# Patient Record
Sex: Male | Born: 1985 | Race: Black or African American | Hispanic: No | Marital: Single | Smoking: Never smoker
Health system: Southern US, Community
[De-identification: ages and names within clinical notes are randomized; demographics above are authoritative.]

## PROBLEM LIST (undated history)

## (undated) DIAGNOSIS — K429 Umbilical hernia without obstruction or gangrene: Secondary | ICD-10-CM

## (undated) HISTORY — PX: CYST EXCISION: SHX5701

## (undated) HISTORY — PX: WISDOM TOOTH EXTRACTION: SHX21

---
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
## 2009-11-30 ENCOUNTER — Emergency Department (HOSPITAL_COMMUNITY): Admission: EM | Admit: 2009-11-30 | Discharge: 2009-11-30 | Payer: Self-pay | Admitting: Family Medicine

## 2010-01-30 ENCOUNTER — Emergency Department (HOSPITAL_COMMUNITY): Admission: EM | Admit: 2010-01-30 | Discharge: 2010-01-30 | Payer: Self-pay | Admitting: Family Medicine

## 2013-04-29 ENCOUNTER — Ambulatory Visit: Payer: Self-pay | Admitting: Internal Medicine

## 2013-05-13 ENCOUNTER — Ambulatory Visit: Payer: Self-pay | Admitting: Internal Medicine

## 2013-06-05 ENCOUNTER — Encounter: Payer: Self-pay | Admitting: Internal Medicine

## 2013-06-05 ENCOUNTER — Ambulatory Visit (INDEPENDENT_AMBULATORY_CARE_PROVIDER_SITE_OTHER): Payer: BC Managed Care – PPO | Admitting: Internal Medicine

## 2013-06-05 VITALS — BP 110/82 | HR 72 | Temp 98.1°F | Ht 77.0 in | Wt 195.0 lb

## 2013-06-05 DIAGNOSIS — Z23 Encounter for immunization: Secondary | ICD-10-CM

## 2013-06-05 DIAGNOSIS — Z0000 Encounter for general adult medical examination without abnormal findings: Secondary | ICD-10-CM

## 2013-06-05 LAB — BASIC METABOLIC PANEL
BUN: 9 mg/dL (ref 6–23)
CO2: 28 mEq/L (ref 19–32)
Calcium: 9.6 mg/dL (ref 8.4–10.5)
Chloride: 104 mEq/L (ref 96–112)
Creatinine, Ser: 1 mg/dL (ref 0.4–1.5)
GFR: 109.62 mL/min (ref >60.00)
Glucose, Bld: 102 mg/dL — ABNORMAL HIGH (ref 70–99)
Potassium: 3.7 mEq/L (ref 3.5–5.1)
Sodium: 138 mEq/L (ref 135–145)

## 2013-06-05 LAB — HEPATIC FUNCTION PANEL
ALT: 24 U/L (ref 0–53)
AST: 18 U/L (ref 0–37)
Albumin: 4.4 g/dL (ref 3.5–5.2)
Alkaline Phosphatase: 66 U/L (ref 39–117)
Bilirubin, Direct: 0 mg/dL (ref 0.0–0.3)
Total Bilirubin: 1 mg/dL (ref 0.3–1.2)
Total Protein: 7.8 g/dL (ref 6.0–8.3)

## 2013-06-05 LAB — LIPID PANEL
Cholesterol: 196 mg/dL (ref 0–200)
HDL: 63.8 mg/dL (ref >39.00)
LDL Cholesterol: 114 mg/dL — ABNORMAL HIGH (ref 0–99)
Total CHOL/HDL Ratio: 3
Triglycerides: 91 mg/dL (ref 0.0–149.0)
VLDL: 18.2 mg/dL (ref 0.0–40.0)

## 2013-06-05 LAB — TSH: TSH: 0.37 u[IU]/mL (ref 0.35–5.50)

## 2013-06-05 NOTE — Assessment & Plan Note (Signed)
Reviewed adult health maintenance protocols.  I encouraged healthy diet and regular exercise. Patient updated with Tdap.  Obtain screening labs.

## 2013-06-05 NOTE — Patient Instructions (Signed)
Please sign up for MyChart to view your lab results.

## 2013-06-05 NOTE — Progress Notes (Signed)
  Subjective:    Patient ID: Manuel Murray, male    DOB: Dec 19, 1985, 27 y.o.   MRN: [id number]  HPI  27 year old [place] American male here for routine physical. He denies any history of chronic illnesses. He did not take any medications.  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  He grew up in [place]. He moved to [place] 10 years ago.  He does not exercise on a regular basis.  He enjoys sweets.  Not enough fresh fruits and vegetables.  Review of Systems  Constitutional: Negative for activity change, appetite change and unexpected weight change.  Eyes: Negative for visual disturbance.  Respiratory: Negative for cough, chest tightness and shortness of breath.   Cardiovascular: Negative for chest pain.  Genitourinary: Negative for difficulty urinating.  Neurological: Negative for headaches.  Gastrointestinal: Negative for abdominal pain, heartburn melena or hematochezia Psych: Negative for depression or anxiety ID:  No hx of STDs.  No currently sexually active  History reviewed. No pertinent past medical history.  History   Social History  . Marital Status: Single    Spouse Name: N/A    Number of Children: N/A  . Years of Education: N/A   Occupational History  . Teacher's assistant     Kindergarten   Social History Main Topics  . Smoking status: Never Smoker   . Smokeless tobacco: Not on file  . Alcohol Use: Yes  . Drug Use: No  . Sexually Active: Not on file   Other Topics Concern  . Not on file   Social History Narrative   Single    Past Surgical History  Procedure Laterality Date  . Wisdom tooth extraction      Family History  Problem Relation Age of Onset  . Myasthenia gravis Mother   . Breast cancer Paternal Grandmother     Allergies  Allergen Reactions  . Penicillins     No current outpatient prescriptions on file prior to visit.   No current facility-administered medications on file  prior to visit.    BP 110/82  Pulse 72  Temp(Src) 98.1 F (36.7 C) (Oral)  Ht 6\' 5"  (1.956 m)  Wt 195 lb (88.451 kg)  BMI 23.12 kg/m2      Objective:   Physical Exam  Constitutional: He is oriented to person, place, and time. He appears well-developed and well-nourished.  HENT:  Head: Normocephalic and atraumatic.  Right Ear: External ear normal.  Left Ear: External ear normal.  Mouth/Throat: Oropharynx is clear and moist.  Eyes: Conjunctivae and EOM are normal. Pupils are equal, round, and reactive to light.  Neck: Neck supple.  No carotid bruit  Cardiovascular: Normal rate, regular rhythm and normal heart sounds.   No murmur heard. Pulmonary/Chest: Effort normal and breath sounds normal. He has no wheezes.  Abdominal: Soft. Bowel sounds are normal. He exhibits no mass. There is no tenderness.  No organomegaly  Musculoskeletal: Normal range of motion. He exhibits no edema.  Lymphadenopathy:    He has no cervical adenopathy.  Neurological: He is alert and oriented to person, place, and time. No cranial nerve deficit.  Skin: Skin is warm and dry.  Psychiatric: He has a normal mood and affect. His behavior is normal.          Assessment & Plan:

## 2013-06-06 LAB — CBC WITH DIFFERENTIAL/PLATELET
Basophils Absolute: 0.1 10*3/uL (ref 0.0–0.1)
Basophils Relative: 1.1 % (ref 0.0–3.0)
Eosinophils Absolute: 0.4 10*3/uL (ref 0.0–0.7)
Eosinophils Relative: 5.6 % — ABNORMAL HIGH (ref 0.0–5.0)
HCT: 48.5 % (ref 39.0–52.0)
Hemoglobin: 16.2 g/dL (ref 13.0–17.0)
Lymphocytes Relative: 27.5 % (ref 12.0–46.0)
Lymphs Abs: 1.9 10*3/uL (ref 0.7–4.0)
MCHC: 33.3 g/dL (ref 30.0–36.0)
MCV: 87.2 fl (ref 78.0–100.0)
Monocytes Absolute: 0.5 10*3/uL (ref 0.1–1.0)
Monocytes Relative: 7.2 % (ref 3.0–12.0)
Neutro Abs: 4.2 10*3/uL (ref 1.4–7.7)
Neutrophils Relative %: 58.6 % (ref 43.0–77.0)
Platelets: 215 10*3/uL (ref 150.0–400.0)
RBC: 5.57 Mil/uL (ref 4.22–5.81)
RDW: 13.3 % (ref 11.5–14.6)
WBC: 7.1 10*3/uL (ref 4.5–10.5)

## 2013-06-06 LAB — HIV ANTIBODY (ROUTINE TESTING W REFLEX): HIV: NONREACTIVE

## 2013-10-23 ENCOUNTER — Other Ambulatory Visit: Payer: Self-pay

## 2013-12-04 ENCOUNTER — Telehealth: Payer: Self-pay | Admitting: Internal Medicine

## 2013-12-04 NOTE — Telephone Encounter (Signed)
[facility] Triage Call Report Triage Record Num: [id number] Operator: Valene Bors Patient Name: Manuel Murray Call Date & Time: 12/03/2013 5:21:29PM Patient Phone: [phone] PCP: Thomos Lemons Patient Gender: Male PCP Fax : [phone] Patient DOB: [birth year] [facility] Name: [facility] Reason for Call: Caller: [name]/Patient; PCP: Ethelene Hal Molly Maduro) (Adults only); CB#: [phone]; Call regarding started not feeling well on 12/01/13 with fever and vomiting. Today he is feeling tired and getting chills but no fever. No cough or congestion. He is taking Muscinex Cold and Flu formuls. Chest feel tight periodically. 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 Triage and Care advice per Flu Like Symptoms Protocol and advised to call back if sx worsen or persist. Protocol(s) Used: Flu-Like Symptoms Recommended Outcome per Protocol: Provide Home/Self Care Reason for Outcome: All other situations Care Advice: ~ Use a cool mist humidifier to moisten air. Be sure to clean according to manufacturer's instructions. ~ Rest until symptoms improve. ~ Consider use of a saline nasal spray per package directions to help relieve nasal congestion. ~ SYMPTOM / CONDITION MANAGEMENT Coughing up mucus or phlegm helps to get rid of an infection. A productive cough should not be stopped. A cough medicine with guaifenesin (Robitussin, Mucinex) can help loosen the mucus. Cough medicine with dextromethorphan (DM) should be avoided. Drinking lots of fluids can help loosen the mucus too, especially warm fluids. ~ COMFORT MEASURES FOR A FEVER: - Drink cool liquids or eat ice chips or popsicles. Avoid drinks with alcohol or caffeine. - Wear one layer of light-weight clothing. - Consider using a fan to improve circulation. - Rest until temperature returns to normal and other symptoms improve. - Use a lightweight blanket or other bedding. - A  lukewarm (not cold) bath or shower can help lower body temperature. Cold water can cause shivering and raise temperature. If shivering starts, dry off and cover with lightweight clothing. ~ Analgesic/Antipyretic Advice - Acetaminophen: Consider acetaminophen as directed on label or by pharmacist/provider for pain or fever. PRECAUTIONS: - Use only if there is no history of liver disease, alcoholism, or intake of three or more alcohol drinks per day. - If approved by provider when breastfeeding. - Do not exceed recommended dose or frequency. ~ Analgesic/Antipyretic Advice - NSAIDs: Consider aspirin, ibuprofen, naproxen or ketoprofen for pain or fever as directed on label or by pharmacist/provider. PRECAUTIONS: - If over 73 years of age, should not take longer than 1 week without consulting provider. EXCEPTIONS: - Should not be used if taking blood thinners or have bleeding problems. - Do not use if have history of sensitivity/allergy to any of these medications; or history of cardiovascular, ulcer, kidney, liver disease or diabetes unless approved by provider. - Do not exceed recommended dose or frequency. ~ Influenza - Expected Course: - Symptoms start to improve in 3 to 7 days. ~ 12/03/2013 5:41:59PM Page 1 of 2 [id number] [facility] Triage Call Report Patient Name: Manuel Murray continuation page/s - Cough and feeling tired (malaise) may continue for several weeks. - Cough and extreme fatigue lasting more than 3 weeks needs medical evaluation. - Remain at home at least 24 hours after being free of fever 100F (37.8C) without the use of fever reducing medication. Influenza - Transmission: - Flu virus is spread through the air in droplets when a flu-infected person coughs, sneezes or talks and another person breathes in the droplets, or by touching a surface like a door knob, telephone, or keyboard that has been  contaminated by the droplets and then touching the mouth,  nose, or eyes. - An individual may be passing on the flu before they have any symptoms. - An individual may continue to be contagious with the flu for up to 7 days after the symptoms start. H1N1 influenza may continue to be contagious as long as cough persists. ~ Influenza Respiratory Hygiene: - Cover the nose/mouth tightly with a tissue when coughing or sneezing. - Use tissue one time and discard in the nearest waste receptacle. - Wash hands with soap and water or alcohol-based hand rub for at least 15 seconds after coming into contact with respiratory secretions and contaminated objects/materials. - When a tissue is not available, cough into the bend of the elbow. - Avoid touching your eyes, nose or mouth. - When ill wear a disposable face mask when around others, especially around those at high risk for flu-related complications, to help decrease the likelihood of infecting them. ~ Influenza Prevention - Personal Hygiene: - Wash your hands with soap and water often, for at least 15 seconds, especially after you cough or sneeze or when you have touched a contaminated surface/object (door knob, telephone, etc.) - If soap and water are not available, use an alcohol-based hand cleaner containing at least 60% alcohol, rub hands together until hands are dry. - Avoid putting your hands and fingers to the face, nose, mouth or eyes. - During the flu season avoid crowded places (shopping areas, planes, sporting events). ~ Do not go to work, school, or any community activity until you have not had a fever (100F or 37.8C) for at least 24 hours without taking fever-reducing medication. This means staying at home for at least 3 to 5, possibly 7 days. ~ ~ If having flu-like symptoms, postpone any travel, especially international travel. You may be quarantined. Total water intake includes drinking water, water in beverages, and water contained in food. Fluids make up about 80% of the body's total  hydration need. Individual fluid requirement to maintain hydration vary based on physical activity, environmental factors and illness. Limit fluids that contain sugar, caffeine, or alcohol. Urine will be very light yellow color when you drink enough fluids. ~ 12

## 2014-02-09 ENCOUNTER — Encounter: Payer: Self-pay | Admitting: Internal Medicine

## 2014-02-09 ENCOUNTER — Ambulatory Visit (INDEPENDENT_AMBULATORY_CARE_PROVIDER_SITE_OTHER): Payer: BC Managed Care – PPO | Admitting: Internal Medicine

## 2014-02-09 VITALS — BP 110/82 | Temp 99.1°F | Ht 77.0 in | Wt 195.0 lb

## 2014-02-09 DIAGNOSIS — H6120 Impacted cerumen, unspecified ear: Secondary | ICD-10-CM

## 2014-02-09 DIAGNOSIS — J069 Acute upper respiratory infection, unspecified: Secondary | ICD-10-CM

## 2014-02-09 MED ORDER — DOXYCYCLINE HYCLATE 100 MG PO TABS: 100.0000 mg | ORAL_TABLET | Freq: Two times a day (BID) | ORAL | Status: DC

## 2014-02-09 NOTE — Patient Instructions (Signed)
Gargle with warm salt water and use intranasal saline as directed Please contact our office if your symptoms do not improve or gets worse.

## 2014-02-09 NOTE — Progress Notes (Signed)
   Subjective:    Patient ID: Manuel Murray, male    DOB: 09/23/1986, 28 y.o.   MRN: [id number]  HPI  28 year old African American male complains of sinus/upper respiratory congestion for 5-6 days. He also has associated nonproductive cough. When he blows his nose his sputum is yellowish in color. He denies any severe headache or sinus pressure.  He has low-grade fever today but denies any fever or chills.  Patient has history of penicillin allergy. Allergic reaction occurred when he was an infant. He does not know his reaction to penicillin.  He also complains of fullness sensation of both ears.  He has hx of cerumen impaction.  Review of Systems No fever, yellowish sputum.    No past medical history on file.  History   Social History  . Marital Status: Single    Spouse Name: N/A    Number of Children: N/A  . Years of Education: N/A   Occupational History  . Teacher's assistant     Kindergarten   Social History Main Topics  . Smoking status: Never Smoker   . Smokeless tobacco: Not on file  . Alcohol Use: Yes  . Drug Use: No  . Sexual Activity: Not on file   Other Topics Concern  . Not on file   Social History Narrative   Single    Past Surgical History  Procedure Laterality Date  . Wisdom tooth extraction      Family History  Problem Relation Age of Onset  . Myasthenia gravis Mother   . Breast cancer Paternal Grandmother     Allergies  Allergen Reactions  . Penicillins     No current outpatient prescriptions on file prior to visit.   No current facility-administered medications on file prior to visit.    BP 110/82  Temp(Src) 99.1 F (37.3 C) (Oral)  Ht 6\' 5"  (1.956 m)  Wt 195 lb (88.451 kg)  BMI 23.12 kg/m2    Objective:   Physical Exam  Constitutional: He is oriented to person, place, and time. He appears well-developed and well-nourished. No distress.  HENT:  Head: Normocephalic and atraumatic.  Mouth/Throat: No oropharyngeal  exudate.  Bilateral cerumen impaction Oropharyngeal erythema  Neck:  No neck tenderness  Cardiovascular: Normal rate, regular rhythm and normal heart sounds.   No murmur heard. Pulmonary/Chest: Effort normal and breath sounds normal. He has no wheezes. He has no rales.  Lymphadenopathy:    He has no cervical adenopathy.  Neurological: He is alert and oriented to person, place, and time. No cranial nerve deficit.  Psychiatric: He has a normal mood and affect. His behavior is normal.          Assessment & Plan:

## 2014-02-09 NOTE — Progress Notes (Signed)
Pre visit review using our clinic review tool, if applicable. No additional management support is needed unless otherwise documented below in the visit note. 

## 2014-02-09 NOTE — Assessment & Plan Note (Signed)
Patient has bilateral cerumen impaction. Bilateral external auditory canals were irrigated. Utilized to curette to remove cerumen plug of left ear and partial removal of cerumen plug of right ear.

## 2014-02-09 NOTE — Assessment & Plan Note (Signed)
28 year old [place] American male with upper respiratory infection x 1 week. Patient may have early sinusitis. I suggested symptomatic treatment for additional 2-3 days. If worsening sinus congestion and purulent nasal discharge patient to use doxycycline 100 mg twice daily for 10 days. Patient advised to call office if symptoms persist or worsen.

## 2014-04-29 ENCOUNTER — Ambulatory Visit: Payer: BC Managed Care – PPO | Admitting: Internal Medicine

## 2014-06-08 ENCOUNTER — Other Ambulatory Visit (INDEPENDENT_AMBULATORY_CARE_PROVIDER_SITE_OTHER): Payer: BC Managed Care – PPO

## 2014-06-08 DIAGNOSIS — Z0000 Encounter for general adult medical examination without abnormal findings: Secondary | ICD-10-CM

## 2014-06-08 LAB — HEPATIC FUNCTION PANEL
ALT: 31 U/L (ref 0–53)
AST: 26 U/L (ref 0–37)
Albumin: 4.1 g/dL (ref 3.5–5.2)
Alkaline Phosphatase: 68 U/L (ref 39–117)
Bilirubin, Direct: 0.1 mg/dL (ref 0.0–0.3)
Total Bilirubin: 0.5 mg/dL (ref 0.2–1.2)
Total Protein: 6.8 g/dL (ref 6.0–8.3)

## 2014-06-08 LAB — BASIC METABOLIC PANEL
BUN: 10 mg/dL (ref 6–23)
CO2: 30 mEq/L (ref 19–32)
Calcium: 9.5 mg/dL (ref 8.4–10.5)
Chloride: 105 mEq/L (ref 96–112)
Creatinine, Ser: 1 mg/dL (ref 0.4–1.5)
GFR: 116.55 mL/min (ref >60.00)
Glucose, Bld: 96 mg/dL (ref 70–99)
Potassium: 4.2 mEq/L (ref 3.5–5.1)
Sodium: 141 mEq/L (ref 135–145)

## 2014-06-08 LAB — LIPID PANEL
Cholesterol: 178 mg/dL (ref 0–200)
HDL: 65.3 mg/dL (ref >39.00)
LDL Cholesterol: 91 mg/dL (ref 0–99)
NonHDL: 112.7
Total CHOL/HDL Ratio: 3
Triglycerides: 107 mg/dL (ref 0.0–149.0)
VLDL: 21.4 mg/dL (ref 0.0–40.0)

## 2014-06-08 LAB — TSH: TSH: 1.19 u[IU]/mL (ref 0.35–4.50)

## 2014-06-08 LAB — POCT URINALYSIS DIPSTICK
Bilirubin, UA: NEGATIVE
Blood, UA: NEGATIVE
Glucose, UA: NEGATIVE
Ketones, UA: NEGATIVE
Leukocytes, UA: NEGATIVE
Nitrite, UA: NEGATIVE
Protein, UA: NEGATIVE
Spec Grav, UA: >=1.03
Urobilinogen, UA: 0.2
pH, UA: 5.5

## 2014-06-08 LAB — CBC WITH DIFFERENTIAL/PLATELET
Basophils Absolute: 0.1 10*3/uL (ref 0.0–0.1)
Basophils Relative: 0.8 % (ref 0.0–3.0)
Eosinophils Absolute: 0.5 10*3/uL (ref 0.0–0.7)
Eosinophils Relative: 7.9 % — ABNORMAL HIGH (ref 0.0–5.0)
HCT: 45.6 % (ref 39.0–52.0)
Hemoglobin: 15.1 g/dL (ref 13.0–17.0)
Lymphocytes Relative: 33.1 % (ref 12.0–46.0)
Lymphs Abs: 2.3 10*3/uL (ref 0.7–4.0)
MCHC: 33.2 g/dL (ref 30.0–36.0)
MCV: 86.2 fl (ref 78.0–100.0)
Monocytes Absolute: 0.6 10*3/uL (ref 0.1–1.0)
Monocytes Relative: 8.5 % (ref 3.0–12.0)
Neutro Abs: 3.4 10*3/uL (ref 1.4–7.7)
Neutrophils Relative %: 49.7 % (ref 43.0–77.0)
Platelets: 212 10*3/uL (ref 150.0–400.0)
RBC: 5.3 Mil/uL (ref 4.22–5.81)
RDW: 13.6 % (ref 11.5–15.5)
WBC: 6.8 10*3/uL (ref 4.0–10.5)

## 2014-06-15 ENCOUNTER — Encounter: Payer: Self-pay | Admitting: Internal Medicine

## 2014-06-15 ENCOUNTER — Ambulatory Visit (INDEPENDENT_AMBULATORY_CARE_PROVIDER_SITE_OTHER): Payer: BC Managed Care – PPO | Admitting: Internal Medicine

## 2014-06-15 VITALS — BP 104/74 | HR 68 | Temp 98.2°F | Ht 76.0 in | Wt 198.0 lb

## 2014-06-15 DIAGNOSIS — H0019 Chalazion unspecified eye, unspecified eyelid: Secondary | ICD-10-CM

## 2014-06-15 DIAGNOSIS — Z111 Encounter for screening for respiratory tuberculosis: Secondary | ICD-10-CM

## 2014-06-15 DIAGNOSIS — Z0000 Encounter for general adult medical examination without abnormal findings: Secondary | ICD-10-CM

## 2014-06-15 DIAGNOSIS — H0013 Chalazion right eye, unspecified eyelid: Secondary | ICD-10-CM

## 2014-06-15 NOTE — Progress Notes (Signed)
   Subjective:    Patient ID: Manuel Murray, male    DOB: Mar 04, 1986, 28 y.o.   MRN: [id number]  HPI  28 year old [place] American male for routine physical. Patient denies any significant interval history. Patient's social history and family history reviewed and updated.  He has red bump over right eye lid.  Review of Systems   Constitutional: Negative for activity change, appetite change and unexpected weight change.  Eyes: Negative for visual disturbance.  Respiratory: Negative for cough, chest tightness and shortness of breath.   Cardiovascular: Negative for chest pain.  Genitourinary: Negative for difficulty urinating.  Neurological: Negative for headaches.  Gastrointestinal: Negative for abdominal pain, heartburn melena or hematochezia Psych: Negative for depression or anxiety ID: negative for GU symptoms.  He is not sexually active.        No past medical history on file.  History   Social History  . Marital Status: Single    Spouse Name: N/A    Number of Children: N/A  . Years of Education: N/A   Occupational History  . Teacher's assistant     Kindergarten   Social History Main Topics  . Smoking status: Never Smoker   . Smokeless tobacco: Not on file  . Alcohol Use: Yes  . Drug Use: No  . Sexual Activity: Not on file   Other Topics Concern  . Not on file   Social History Narrative   Single    Past Surgical History  Procedure Laterality Date  . Wisdom tooth extraction      Family History  Problem Relation Age of Onset  . Myasthenia gravis Mother   . Breast cancer Paternal Grandmother     Allergies  Allergen Reactions  . Penicillins     No current outpatient prescriptions on file prior to visit.   No current facility-administered medications on file prior to visit.    BP 104/74  Pulse 68  Temp(Src) 98.2 F (36.8 C) (Oral)  Ht 6\' 4"  (1.93 m)  Wt 198 lb (89.812 kg)  BMI 24.11 kg/m2    Objective:   Physical Exam    Constitutional: He is oriented to person, place, and time. He appears well-developed and well-nourished. No distress.  HENT:  Head: Normocephalic and atraumatic.  Right Ear: External ear normal.  Left Ear: External ear normal.  Mouth/Throat: Oropharynx is clear and moist.  Eyes: Conjunctivae and EOM are normal. Pupils are equal, round, and reactive to light.  Right upper eyelid chalazion  Neck: Neck supple. No thyromegaly present.  Cardiovascular: Normal rate, regular rhythm and normal heart sounds.   No murmur heard. Pulmonary/Chest: Effort normal and breath sounds normal. He has no wheezes.  Abdominal: Soft. Bowel sounds are normal. He exhibits no distension and no mass.  No organomegaly  Musculoskeletal: He exhibits no edema.  Lymphadenopathy:    He has no cervical adenopathy.  Neurological: He is alert and oriented to person, place, and time. No cranial nerve deficit.  Psychiatric: He has a normal mood and affect. His behavior is normal.          Assessment & Plan:

## 2014-06-15 NOTE — Progress Notes (Signed)
Pre visit review using our clinic review tool, if applicable. No additional management support is needed unless otherwise documented below in the visit note. 

## 2014-06-15 NOTE — Assessment & Plan Note (Signed)
Reviewed adult health maintenance protocols.  Screening labs reviewed with patient.  Social and family history updated. Patient up-to-date with adult vaccines. Patient's weight is normal.

## 2014-06-15 NOTE — Assessment & Plan Note (Signed)
Patient has chalazion right upper eyelid. Patient advised to use warm compress. If persistent or worsening symptoms, he will followup with ophthalmologist.

## 2014-06-17 LAB — TB SKIN TEST
Induration: 0 mm
TB Skin Test: NEGATIVE

## 2014-06-17 NOTE — Addendum Note (Signed)
Addended by: Townsend Roger D on: 06/17/2014 05:03 PM   Modules accepted: Orders

## 2015-04-15 ENCOUNTER — Encounter: Payer: Self-pay | Admitting: Family Medicine

## 2015-04-15 ENCOUNTER — Ambulatory Visit (INDEPENDENT_AMBULATORY_CARE_PROVIDER_SITE_OTHER): Payer: BC Managed Care – PPO | Admitting: Family Medicine

## 2015-04-15 VITALS — BP 110/80 | HR 70 | Temp 98.0°F | Wt 202.0 lb

## 2015-04-15 DIAGNOSIS — K116 Mucocele of salivary gland: Secondary | ICD-10-CM | POA: Diagnosis not present

## 2015-04-15 NOTE — Progress Notes (Signed)
   Subjective:    Patient ID: Manuel Murray, male    DOB: [birth year], 29 y.o.   MRN: [id number]  HPI  Patient seen with swelling left cheek region. He's noticed similar cystic swelling in the past but particularly over the past 1 day. No injury. No real pain. No exacerbating or alleviating factors. He has not noted any skin erythema. No neck adenopathy. No appetite or weight changes.  No past medical history on file. Past Surgical History  Procedure Laterality Date  . Wisdom tooth extraction      reports that he has never smoked. He does not have any smokeless tobacco history on file. He reports that he drinks alcohol. He reports that he does not use illicit drugs. family history includes Breast cancer in his paternal grandmother; Myasthenia gravis in his mother. There is no history of Colon cancer or Prostate cancer. Allergies  Allergen Reactions  . Penicillins      Review of Systems  Constitutional: Negative for fever, chills, appetite change and unexpected weight change.  Respiratory: Negative for shortness of breath.   Hematological: Negative for adenopathy.       Objective:   Physical Exam  Constitutional: He appears well-developed and well-nourished.  HENT:  Mouth/Throat: Oropharynx is clear and moist.  1 cm mobile nontender cystic swelling right parotid region near Stensen's duct  Neck: Neck supple.  Minimally palpable anterior cervical nodes bilaterally.  Skin: No rash noted.          Assessment & Plan:  Probable benign mucous cyst right parotid. This has benign features. Is rounded and mobile nontender. We explained these are usually self-limited and go away with time. Follow-up for any associated pain, rapid growth, or other changes

## 2015-04-15 NOTE — Progress Notes (Signed)
Pre visit review using our clinic review tool, if applicable. No additional management support is needed unless otherwise documented below in the visit note. 

## 2015-04-15 NOTE — Patient Instructions (Signed)
I suspect you have a benign mucous cyst of the salivary gland These are usually self limited and will go away with time without treatment Follow up promptly for any redness, pain, or increased growth.

## 2015-05-03 ENCOUNTER — Ambulatory Visit: Payer: BC Managed Care – PPO | Admitting: Family Medicine

## 2015-05-06 ENCOUNTER — Encounter: Payer: Self-pay | Admitting: Family Medicine

## 2015-05-06 ENCOUNTER — Ambulatory Visit (INDEPENDENT_AMBULATORY_CARE_PROVIDER_SITE_OTHER): Payer: BC Managed Care – PPO | Admitting: Family Medicine

## 2015-05-06 VITALS — BP 120/80 | HR 63 | Temp 97.8°F | Wt 205.0 lb

## 2015-05-06 DIAGNOSIS — L0201 Cutaneous abscess of face: Secondary | ICD-10-CM

## 2015-05-06 MED ORDER — DOXYCYCLINE HYCLATE 100 MG PO CAPS: 100.0000 mg | ORAL_CAPSULE | Freq: Two times a day (BID) | ORAL | Status: DC

## 2015-05-06 NOTE — Patient Instructions (Signed)
Warm compresses several times daily Let me know if no better in 2 weeks.

## 2015-05-06 NOTE — Progress Notes (Signed)
Pre visit review using our clinic review tool, if applicable. No additional management support is needed unless otherwise documented below in the visit note. 

## 2015-05-06 NOTE — Progress Notes (Signed)
   Subjective:    Patient ID: Manuel Murray, male    DOB: Jan 28, 1986, 29 y.o.   MRN: [id number]  HPI Patient recently seen with suspected mucous retention cyst left cheek region. This was very mobile and nontender. However, he had some spontaneous drainage several days ago and then again a couple days ago started draining again. He has not noted any overlying erythema. Nontender. No fevers or chills. No history of MRSA. Allergy to penicillin.  No past medical history on file. Past Surgical History  Procedure Laterality Date  . Wisdom tooth extraction      reports that he has never smoked. He does not have any smokeless tobacco history on file. He reports that he drinks alcohol. He reports that he does not use illicit drugs. family history includes Breast cancer in his paternal grandmother; Myasthenia gravis in his mother. There is no history of Colon cancer or Prostate cancer. Allergies  Allergen Reactions  . Penicillins       Review of Systems  Constitutional: Negative for fever and chills.       Objective:   Physical Exam  Constitutional: He appears well-developed and well-nourished.  Cardiovascular: Normal rate and regular rhythm.   Skin:  Patient has area of in duration about 1/2 cm diameter left cheek with some spontaneous purulent type drainage. No fluctuance. Nontender.          Assessment & Plan:  Abscess left cheek which is already draining. This raises likelihood this was a sebaceous type cyst-now infected though no skin dimpling had been noted. We recommended warm compresses several times daily. Elected not to do incision and drainage since is already draining fairly well. Start doxycycline 100 mg twice daily or 10 days. Drainage cultured. Consider ENT referral if not resolving over the next 10 days to 2 weeks

## 2015-05-10 LAB — WOUND CULTURE
Gram Stain: NONE SEEN
Gram Stain: NONE SEEN

## 2015-06-16 ENCOUNTER — Telehealth: Payer: Self-pay | Admitting: Internal Medicine

## 2015-06-16 NOTE — Telephone Encounter (Signed)
[place] for [place]; [place]

## 2015-06-16 NOTE — Telephone Encounter (Signed)
Please advise 

## 2015-06-17 NOTE — Telephone Encounter (Signed)
Pt following up on med request.  Pt leaving for [place] about 7pm tonite to fly out of there.

## 2015-06-18 NOTE — Telephone Encounter (Signed)
Pt aware, he call [facility]

## 2015-06-18 NOTE — Telephone Encounter (Signed)
She needs to contact travel med dept at [facility] [employer]  They need to know where she is traveling to in order to determine appropriate malaria prophylaxis

## 2015-06-18 NOTE — Telephone Encounter (Signed)
Left message for pt to call back  °

## 2015-08-16 ENCOUNTER — Other Ambulatory Visit (HOSPITAL_COMMUNITY): Payer: Self-pay | Admitting: Otolaryngology

## 2015-08-17 NOTE — H&P (Signed)
08/20/15 7:18 AM  Ruby Cola  PREOPERATIVE HISTORY AND PHYSICAL  CHIEF COMPLAINT: recurrent left facial mass  HISTORY: This is a 29 year old who presents with recurrent left facial mass.  He now presents for revision left facial mass excision.  Dr. Simeon Craft, [place] [facility] has discussed the risks (recurrent, facial nerve injury, bleeding, infection, etc.), benefits, and alternatives of this procedure. The patient understands the risks and would like to proceed with the procedure. The chances of success of the procedure are >50% and the patient understands this. I personally performed an examination of the patient within 24 hours of the procedure.  PAST MEDICAL HISTORY: No past medical history on file.  PAST SURGICAL HISTORY: Past Surgical History  Procedure Laterality Date  . Wisdom tooth extraction      MEDICATIONS: No current facility-administered medications on file prior to encounter.   Current Outpatient Prescriptions on File Prior to Encounter  Medication Sig Dispense Refill  . doxycycline (VIBRAMYCIN) 100 MG capsule Take 1 capsule (100 mg total) by mouth 2 (two) times daily. 20 capsule 0    ALLERGIES: Allergies  Allergen Reactions  . Penicillins       SOCIAL HISTORY: Social History   Social History  . Marital Status: Single    Spouse Name: N/A  . Number of Children: N/A  . Years of Education: N/A   Occupational History  . Teacher's assistant     Kindergarten   Social History Main Topics  . Smoking status: Never Smoker   . Smokeless tobacco: Not on file  . Alcohol Use: Yes  . Drug Use: No  . Sexual Activity: Not on file   Other Topics Concern  . Not on file   Social History Narrative   Single    FAMILY HISTORY:  Family History  Problem Relation Age of Onset  . Myasthenia gravis Mother   . Breast cancer Paternal Grandmother   . Colon cancer Neg Hx   . Prostate cancer Neg Hx     REVIEW OF SYSTEMS:  HEENT: left facial mass/swelling, otherwise negative  x 12 systems except per HPI.   PHYSICAL EXAM:  GENERAL: NAD  VITAL SIGNS:   Filed Vitals:   08/20/15 0609  BP: 113/78  Pulse: 64  Temp: 97.8 F (36.6 C)  Resp: 20   SKIN/HEENT: ~ 1cm recurrent left facial cyst with erythema and drainage NECK:  supple LYMPH:  no LAD ABDOMEN:  Soft , NT MUSCULOSKELETAL: normal strength  PSYCH:  Normal affect NEUROLOGIC:  CN 2-12 intact and symmetric  DIAGNOSTIC STUDIES: previous left facial cyst pathology showed foreign body reaction and chronic inflammation related to ruptured cyst.  ASSESSMENT AND PLAN: Plan to proceed with revision excision of left facial cyst. Patient understands the risks, benefits, and alternatives.  08/20/15  7:18 AM Ruby Cola

## 2015-08-19 ENCOUNTER — Encounter (HOSPITAL_COMMUNITY): Payer: Self-pay | Admitting: *Deleted

## 2015-08-19 NOTE — Progress Notes (Signed)
Pt denies SOB, chest pain, and being under the care of a cardiologist. Pt denies having a stress test, echo and cardiac cath. Pt denies having a chest x ary and EKG within the last year. Pt made aware to stop  taking Aspirin, otc vitamins and herbal medications. Do not take any NSAIDs ie: Ibuprofen, Advil, Naproxen or any medication containing Aspirin. Pt verbalized understanding of all pre-op instructions.

## 2015-08-19 NOTE — Anesthesia Preprocedure Evaluation (Addendum)
Anesthesia Evaluation  Patient identified by MRN, date of birth, ID band Patient awake    Reviewed: Allergy & Precautions, NPO status , Patient's Chart, lab work & pertinent test results  Airway Mallampati: II  TM Distance: >3 FB Neck ROM: Full    Dental no notable dental hx. (+) Teeth Intact   Pulmonary neg pulmonary ROS,  breath sounds clear to auscultation  Pulmonary exam normal       Cardiovascular negative cardio ROS Normal cardiovascular examRhythm:Regular Rate:Normal     Neuro/Psych negative neurological ROS  negative psych ROS   GI/Hepatic negative GI ROS, Neg liver ROS,   Endo/Other  negative endocrine ROS  Renal/GU negative Renal ROS     Musculoskeletal negative musculoskeletal ROS (+)   Abdominal   Peds  Hematology negative hematology ROS (+)   Anesthesia Other Findings   Reproductive/Obstetrics                            Anesthesia Physical Anesthesia Plan  ASA: I  Anesthesia Plan: General   Post-op Pain Management:    Induction: Intravenous  Airway Management Planned: LMA and Oral ETT  Additional Equipment:   Intra-op Plan:   Post-operative Plan: Extubation in OR  Informed Consent: I have reviewed the patients History and Physical, chart, labs and discussed the procedure including the risks, benefits and alternatives for the proposed anesthesia with the patient or authorized representative who has indicated his/her understanding and acceptance.   Dental advisory given  Plan Discussed with: CRNA  Anesthesia Plan Comments:         Anesthesia Quick Evaluation

## 2015-08-20 ENCOUNTER — Ambulatory Visit (HOSPITAL_COMMUNITY)
Admission: RE | Admit: 2015-08-20 | Discharge: 2015-08-20 | Disposition: A | Discharge status: Home / Self Care | Payer: BC Managed Care – PPO | Source: Ambulatory Visit | Attending: Otolaryngology | Admitting: Otolaryngology

## 2015-08-20 ENCOUNTER — Ambulatory Visit (HOSPITAL_COMMUNITY): Payer: BC Managed Care – PPO | Admitting: Anesthesiology

## 2015-08-20 ENCOUNTER — Encounter (HOSPITAL_COMMUNITY)
Admission: RE | Disposition: A | Discharge status: Home / Self Care | Payer: Self-pay | Source: Ambulatory Visit | Attending: Otolaryngology

## 2015-08-20 ENCOUNTER — Encounter (HOSPITAL_COMMUNITY): Payer: Self-pay | Admitting: *Deleted

## 2015-08-20 DIAGNOSIS — Z880 Allergy status to penicillin: Secondary | ICD-10-CM | POA: Insufficient documentation

## 2015-08-20 DIAGNOSIS — R220 Localized swelling, mass and lump, head: Secondary | ICD-10-CM | POA: Diagnosis present

## 2015-08-20 DIAGNOSIS — D2339 Other benign neoplasm of skin of other parts of face: Secondary | ICD-10-CM | POA: Diagnosis not present

## 2015-08-20 HISTORY — DX: Umbilical hernia without obstruction or gangrene: K42.9

## 2015-08-20 HISTORY — PX: EAR CYST EXCISION: SHX22

## 2015-08-20 LAB — CBC
HCT: 46.1 % (ref 39.0–52.0)
Hemoglobin: 15.7 g/dL (ref 13.0–17.0)
MCH: 28.2 pg (ref 26.0–34.0)
MCHC: 34.1 g/dL (ref 30.0–36.0)
MCV: 82.8 fL (ref 78.0–100.0)
Platelets: 213 10*3/uL (ref 150–400)
RBC: 5.57 MIL/uL (ref 4.22–5.81)
RDW: 12.9 % (ref 11.5–15.5)
WBC: 6.7 10*3/uL (ref 4.0–10.5)

## 2015-08-20 SURGERY — CYST REMOVAL
Anesthesia: General | Site: Face | Laterality: Left

## 2015-08-20 MED ORDER — PROPOFOL 10 MG/ML IV BOLUS
INTRAVENOUS | Status: AC
  Filled 2015-08-20: qty 20

## 2015-08-20 MED ORDER — PROPOFOL 10 MG/ML IV BOLUS
INTRAVENOUS | Status: DC | PRN
  Administered 2015-08-20: 300 mg via INTRAVENOUS

## 2015-08-20 MED ORDER — ONDANSETRON HCL 4 MG/2ML IJ SOLN
INTRAMUSCULAR | Status: DC | PRN
  Administered 2015-08-20: 4 mg via INTRAVENOUS

## 2015-08-20 MED ORDER — LIDOCAINE-EPINEPHRINE 1 %-1:100000 IJ SOLN
INTRAMUSCULAR | Status: AC
  Filled 2015-08-20: qty 1

## 2015-08-20 MED ORDER — BACITRACIN ZINC 500 UNIT/GM EX OINT
TOPICAL_OINTMENT | CUTANEOUS | Status: AC
  Filled 2015-08-20: qty 28.35

## 2015-08-20 MED ORDER — LIDOCAINE HCL (CARDIAC) 20 MG/ML IV SOLN
INTRAVENOUS | Status: DC | PRN
  Administered 2015-08-20: 100 mg via INTRAVENOUS

## 2015-08-20 MED ORDER — FENTANYL CITRATE (PF) 100 MCG/2ML IJ SOLN
INTRAMUSCULAR | Status: DC | PRN
  Administered 2015-08-20: 25 ug via INTRAVENOUS
  Administered 2015-08-20: 50 ug via INTRAVENOUS
  Administered 2015-08-20: 25 ug via INTRAVENOUS

## 2015-08-20 MED ORDER — 0.9 % SODIUM CHLORIDE (POUR BTL) OPTIME
TOPICAL | Status: DC | PRN
  Administered 2015-08-20: 1000 mL

## 2015-08-20 MED ORDER — BACITRACIN ZINC 500 UNIT/GM EX OINT
TOPICAL_OINTMENT | CUTANEOUS | Status: DC | PRN
  Administered 2015-08-20: 1 via TOPICAL

## 2015-08-20 MED ORDER — PROMETHAZINE HCL 25 MG/ML IJ SOLN: 6.2500 mg | INTRAMUSCULAR | Status: DC | PRN

## 2015-08-20 MED ORDER — LIDOCAINE-EPINEPHRINE 1 %-1:100000 IJ SOLN
INTRAMUSCULAR | Status: DC | PRN
  Administered 2015-08-20: 20 mL

## 2015-08-20 MED ORDER — LACTATED RINGERS IV SOLN
INTRAVENOUS | Status: DC | PRN
  Administered 2015-08-20 (×2): via INTRAVENOUS

## 2015-08-20 MED ORDER — CLINDAMYCIN PHOSPHATE 600 MG/50ML IV SOLN: 600.0000 mg | Freq: Once | INTRAVENOUS | Status: DC

## 2015-08-20 MED ORDER — CLINDAMYCIN PHOSPHATE 600 MG/50ML IV SOLN
INTRAVENOUS | Status: AC
  Administered 2015-08-20: 600 mg via INTRAVENOUS
  Filled 2015-08-20: qty 50

## 2015-08-20 MED ORDER — HYDROMORPHONE HCL 1 MG/ML IJ SOLN: 0.2500 mg | INTRAMUSCULAR | Status: DC | PRN

## 2015-08-20 MED ORDER — MIDAZOLAM HCL 2 MG/2ML IJ SOLN
INTRAMUSCULAR | Status: AC
  Filled 2015-08-20: qty 4

## 2015-08-20 MED ORDER — FENTANYL CITRATE (PF) 250 MCG/5ML IJ SOLN
INTRAMUSCULAR | Status: AC
  Filled 2015-08-20: qty 5

## 2015-08-20 MED ORDER — MIDAZOLAM HCL 5 MG/5ML IJ SOLN
INTRAMUSCULAR | Status: DC | PRN
  Administered 2015-08-20: 2 mg via INTRAVENOUS

## 2015-08-20 MED ORDER — MEPERIDINE HCL 25 MG/ML IJ SOLN: 6.2500 mg | INTRAMUSCULAR | Status: DC | PRN

## 2015-08-20 SURGICAL SUPPLY — 46 items
BENZOIN TINCTURE PRP APPL 2/3 (GAUZE/BANDAGES/DRESSINGS) IMPLANT
BLADE SURG 15 STRL LF DISP TIS (BLADE) ×1 IMPLANT
BLADE SURG 15 STRL SS (BLADE) ×1
CANISTER SUCTION 2500CC (MISCELLANEOUS) ×2 IMPLANT
CLEANER TIP ELECTROSURG 2X2 (MISCELLANEOUS) ×2 IMPLANT
CONT SPEC 4OZ CLIKSEAL STRL BL (MISCELLANEOUS) ×2 IMPLANT
COVER SURGICAL LIGHT HANDLE (MISCELLANEOUS) ×2 IMPLANT
CRADLE DONUT ADULT HEAD (MISCELLANEOUS) ×2 IMPLANT
DECANTER SPIKE VIAL GLASS SM (MISCELLANEOUS) ×2 IMPLANT
DRSG EMULSION OIL 3X3 NADH (GAUZE/BANDAGES/DRESSINGS) IMPLANT
ELECT COATED BLADE 2.86 ST (ELECTRODE) ×4 IMPLANT
ELECT NEEDLE TIP 2.8 STRL (NEEDLE) ×2 IMPLANT
ELECT REM PT RETURN 9FT ADLT (ELECTROSURGICAL) ×2
ELECTRODE REM PT RTRN 9FT ADLT (ELECTROSURGICAL) ×1 IMPLANT
GAUZE SPONGE 4X4 12PLY STRL (GAUZE/BANDAGES/DRESSINGS) IMPLANT
GAUZE SPONGE 4X4 16PLY XRAY LF (GAUZE/BANDAGES/DRESSINGS) ×2 IMPLANT
GLOVE BIO SURGEON STRL SZ7 (GLOVE) ×2 IMPLANT
GLOVE BIO SURGEON STRL SZ8 (GLOVE) ×2 IMPLANT
GLOVE BIOGEL PI IND STRL 7.5 (GLOVE) ×2 IMPLANT
GLOVE BIOGEL PI IND STRL 8.5 (GLOVE) ×1 IMPLANT
GLOVE BIOGEL PI INDICATOR 7.5 (GLOVE) ×2
GLOVE BIOGEL PI INDICATOR 8.5 (GLOVE) ×1
GOWN STRL REUS W/ TWL LRG LVL3 (GOWN DISPOSABLE) ×2 IMPLANT
GOWN STRL REUS W/ TWL XL LVL3 (GOWN DISPOSABLE) ×1 IMPLANT
GOWN STRL REUS W/TWL LRG LVL3 (GOWN DISPOSABLE) ×2
GOWN STRL REUS W/TWL XL LVL3 (GOWN DISPOSABLE) ×1
KIT BASIN OR (CUSTOM PROCEDURE TRAY) ×2 IMPLANT
KIT ROOM TURNOVER OR (KITS) ×2 IMPLANT
NEEDLE HYPO 25GX1X1/2 BEV (NEEDLE) ×2 IMPLANT
NS IRRIG 1000ML POUR BTL (IV SOLUTION) ×2 IMPLANT
PAD ARMBOARD 7.5X6 YLW CONV (MISCELLANEOUS) ×4 IMPLANT
PENCIL BUTTON HOLSTER BLD 10FT (ELECTRODE) ×2 IMPLANT
SPONGE LAP 18X18 X RAY DECT (DISPOSABLE) ×2 IMPLANT
STRIP CLOSURE SKIN 1/2X4 (GAUZE/BANDAGES/DRESSINGS) IMPLANT
SUT CHROMIC 4 0 P 3 18 (SUTURE) ×2 IMPLANT
SUT MNCRL AB 4-0 PS2 18 (SUTURE) ×2 IMPLANT
SUT VIC AB 2-0 SH 27 (SUTURE) ×1
SUT VIC AB 2-0 SH 27X BRD (SUTURE) ×1 IMPLANT
SUT VIC AB 3-0 SH 27 (SUTURE) ×1
SUT VIC AB 3-0 SH 27X BRD (SUTURE) ×1 IMPLANT
SYR BULB IRRIGATION 50ML (SYRINGE) ×2 IMPLANT
SYR CONTROL 10ML LL (SYRINGE) ×2 IMPLANT
TOWEL OR 17X24 6PK STRL BLUE (TOWEL DISPOSABLE) ×2 IMPLANT
TRAY ENT MC OR (CUSTOM PROCEDURE TRAY) ×2 IMPLANT
WATER STERILE IRR 1000ML POUR (IV SOLUTION) ×2 IMPLANT
YANKAUER SUCT BULB TIP NO VENT (SUCTIONS) ×2 IMPLANT

## 2015-08-20 NOTE — Discharge Instructions (Signed)
Follow up with Dr. Simeon Craft in 1 week. OTC Neosporin to left facial incision TID and keep dry x 72 hours post-op then can clean with soap and water. Family has Rx for hydrocodone.
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
## 2015-08-20 NOTE — Transfer of Care (Signed)
Immediate Anesthesia Transfer of Care Note  Patient: Steffon Schaible  Procedure(s) Performed: Procedure(s): CYST REMOVAL (Left)  Patient Location: PACU  Anesthesia Type:General  Level of Consciousness: awake, alert , oriented, patient cooperative and responds to stimulation  Airway & Oxygen Therapy: Patient Spontanous Breathing and Patient connected to nasal cannula oxygen  Post-op Assessment: Report given to RN, Post -op Vital signs reviewed and stable, Patient moving all extremities X 4 and Patient able to stick tongue midline  Post vital signs: stable  Last Vitals:  Filed Vitals:   08/20/15 0831  BP:   Pulse:   Temp: 36.1 C  Resp:     Complications: No apparent anesthesia complications

## 2015-08-20 NOTE — Anesthesia Procedure Notes (Signed)
Procedure Name: LMA Insertion Date/Time: 08/20/2015 7:36 AM Performed by: Vennie Homans Pre-anesthesia Checklist: Patient identified, Timeout performed, Emergency Drugs available, Suction available and Patient being monitored Patient Re-evaluated:Patient Re-evaluated prior to inductionOxygen Delivery Method: Circle system utilized Preoxygenation: Pre-oxygenation with 100% oxygen Intubation Type: IV induction Ventilation: Mask ventilation without difficulty LMA: LMA inserted LMA Size: 5.0 Number of attempts: 1 Placement Confirmation: breath sounds checked- equal and bilateral and positive ETCO2 Tube secured with: Tape Dental Injury: Teeth and Oropharynx as per pre-operative assessment

## 2015-08-20 NOTE — Anesthesia Postprocedure Evaluation (Signed)
Anesthesia Post Note  Patient: Manuel Murray  Procedure(s) Performed: Procedure(s) (LRB): CYST REMOVAL (Left)  Anesthesia type: General  Patient location: PACU  Post pain: Pain level controlled  Post assessment: Post-op Vital signs reviewed  Last Vitals: BP 116/83 mmHg  Pulse 70  Temp(Src) 36.5 C (Oral)  Resp 15  Ht 6\' 5"  (1.956 m)  Wt 218 lb (98.884 kg)  BMI 25.85 kg/m2  SpO2 100%  Post vital signs: Reviewed  Level of consciousness: sedated  Complications: No apparent anesthesia complications

## 2015-08-24 ENCOUNTER — Encounter (HOSPITAL_COMMUNITY): Payer: Self-pay | Admitting: Otolaryngology

## 2015-08-25 NOTE — Anesthesia Postprocedure Evaluation (Deleted)
Anesthesia Post Note  Patient: Manuel Murray  Procedure(s) Performed: Procedure(s) (LRB): CYST REMOVAL (Left)  Anesthesia type: General  Patient location: PACU  Post pain: Pain level controlled  Post assessment: Post-op Vital signs reviewed  Last Vitals: BP 129/96 mmHg  Pulse 68  Temp(Src) 36.5 C (Oral)  Resp 15  Ht 6\' 5"  (1.956 m)  Wt 218 lb (98.884 kg)  BMI 25.85 kg/m2  SpO2 100%  Post vital signs: Reviewed  Level of consciousness: sedated  Complications: No apparent anesthesia complications

## 2020-08-06 ENCOUNTER — Other Ambulatory Visit: Payer: BC Managed Care – PPO

## 2021-01-24 ENCOUNTER — Encounter (HOSPITAL_COMMUNITY): Payer: Self-pay | Admitting: *Deleted

## 2021-01-24 ENCOUNTER — Emergency Department (HOSPITAL_COMMUNITY): Payer: Self-pay

## 2021-01-24 ENCOUNTER — Emergency Department (HOSPITAL_COMMUNITY)
Admission: EM | Admit: 2021-01-24 | Discharge: 2021-01-24 | Disposition: A | Discharge status: Home / Self Care | Payer: Self-pay | Attending: Emergency Medicine | Admitting: Emergency Medicine

## 2021-01-24 DIAGNOSIS — M542 Cervicalgia: Secondary | ICD-10-CM | POA: Insufficient documentation

## 2021-01-24 DIAGNOSIS — M25531 Pain in right wrist: Secondary | ICD-10-CM | POA: Insufficient documentation

## 2021-01-24 DIAGNOSIS — Z5321 Procedure and treatment not carried out due to patient leaving prior to being seen by health care provider: Secondary | ICD-10-CM | POA: Insufficient documentation

## 2021-01-24 DIAGNOSIS — M25561 Pain in right knee: Secondary | ICD-10-CM | POA: Insufficient documentation

## 2021-01-24 DIAGNOSIS — Y92410 Unspecified street and highway as the place of occurrence of the external cause: Secondary | ICD-10-CM | POA: Insufficient documentation
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
## 2021-01-25 ENCOUNTER — Emergency Department (HOSPITAL_BASED_OUTPATIENT_CLINIC_OR_DEPARTMENT_OTHER)
Admission: EM | Admit: 2021-01-25 | Discharge: 2021-01-25 | Disposition: A | Discharge status: Home / Self Care | Payer: No Typology Code available for payment source | Attending: Emergency Medicine | Admitting: Emergency Medicine

## 2021-01-25 ENCOUNTER — Encounter (HOSPITAL_BASED_OUTPATIENT_CLINIC_OR_DEPARTMENT_OTHER): Payer: Self-pay | Admitting: *Deleted

## 2021-01-25 ENCOUNTER — Other Ambulatory Visit: Payer: Self-pay

## 2021-01-25 ENCOUNTER — Emergency Department (HOSPITAL_BASED_OUTPATIENT_CLINIC_OR_DEPARTMENT_OTHER): Payer: No Typology Code available for payment source

## 2021-01-25 DIAGNOSIS — M79644 Pain in right finger(s): Secondary | ICD-10-CM | POA: Insufficient documentation

## 2021-01-25 DIAGNOSIS — M25561 Pain in right knee: Secondary | ICD-10-CM | POA: Insufficient documentation

## 2021-01-25 DIAGNOSIS — M542 Cervicalgia: Secondary | ICD-10-CM | POA: Diagnosis not present

## 2021-01-25 DIAGNOSIS — Y92410 Unspecified street and highway as the place of occurrence of the external cause: Secondary | ICD-10-CM | POA: Insufficient documentation
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
## 2022-02-11 ENCOUNTER — Emergency Department (HOSPITAL_BASED_OUTPATIENT_CLINIC_OR_DEPARTMENT_OTHER)
Admission: EM | Admit: 2022-02-11 | Discharge: 2022-02-11 | Disposition: A | Discharge status: Home / Self Care | Payer: BC Managed Care – PPO | Attending: Student | Admitting: Student

## 2022-02-11 ENCOUNTER — Other Ambulatory Visit: Payer: Self-pay

## 2022-02-11 ENCOUNTER — Encounter (HOSPITAL_BASED_OUTPATIENT_CLINIC_OR_DEPARTMENT_OTHER): Payer: Self-pay | Admitting: Emergency Medicine

## 2022-02-11 ENCOUNTER — Emergency Department (HOSPITAL_BASED_OUTPATIENT_CLINIC_OR_DEPARTMENT_OTHER): Payer: BC Managed Care – PPO

## 2022-02-11 DIAGNOSIS — R079 Chest pain, unspecified: Secondary | ICD-10-CM | POA: Diagnosis present

## 2022-02-11 DIAGNOSIS — Z20822 Contact with and (suspected) exposure to covid-19: Secondary | ICD-10-CM | POA: Insufficient documentation

## 2022-02-11 DIAGNOSIS — R0789 Other chest pain: Secondary | ICD-10-CM | POA: Diagnosis not present

## 2022-02-11 DIAGNOSIS — R059 Cough, unspecified: Secondary | ICD-10-CM | POA: Insufficient documentation

## 2022-02-11 DIAGNOSIS — R0981 Nasal congestion: Secondary | ICD-10-CM | POA: Diagnosis not present

## 2022-02-11 LAB — RESP PANEL BY RT-PCR (FLU A&B, COVID) ARPGX2
Influenza A by PCR: NEGATIVE
Influenza B by PCR: NEGATIVE
SARS Coronavirus 2 by RT PCR: NEGATIVE

## 2022-02-11 MED ORDER — IBUPROFEN 400 MG PO TABS
600.0000 mg | ORAL_TABLET | Freq: Once | ORAL | Status: AC
  Administered 2022-02-11: 600 mg via ORAL
  Filled 2022-02-11: qty 1

## 2022-02-11 MED ORDER — NAPROXEN 500 MG PO TABS: 500.0000 mg | ORAL_TABLET | Freq: Two times a day (BID) | ORAL | 0 refills | Status: AC

## 2022-02-11 MED ORDER — LIDOCAINE 5 % EX PTCH
1.0000 | MEDICATED_PATCH | CUTANEOUS | Status: DC
  Administered 2022-02-11: 1 via TRANSDERMAL
  Filled 2022-02-11: qty 1

## 2022-02-11 MED ORDER — BENZONATATE 100 MG PO CAPS: 100.0000 mg | ORAL_CAPSULE | Freq: Three times a day (TID) | ORAL | 0 refills | Status: AC

## 2022-02-11 NOTE — ED Triage Notes (Signed)
Pt arrives pov, ambulatory to triage, c/o non-radiating left side CP starting last night. Reports recent illness, cough and congestion. Taking sudafed, denies shob

## 2022-02-11 NOTE — Discharge Instructions (Addendum)
You were seen today for chest wall pain. Your symptoms are consistent with musculoskeletal pain. EKG shows no concern for heart injury. I will discharge you with a prescription antiinflammatory. You may also use over the counter lidocaine patches for pain. I recommend follow up with your PCP if your symptoms don't improve. Return to the emergency department if you develop life threats such as worsening or new chest pain, shortness of breath, or altered level of consciousness.

## 2022-02-11 NOTE — ED Provider Notes (Addendum)
[facility] Provider Note   CSN: [id number] Arrival date & time: 02/11/22  1008     History  Chief Complaint  Patient presents with   Chest Pain    Manuel Murray is a 36 y.o. male.  The patient presents today complaining of left-sided chest pain just under the left breast that began yesterday afternoon after coughing. The patient denies symptom radiation. The patient endorses pain with certain movements such as rolling on his side.  The patient states that he has been dealing with a cold for the past 2 weeks.  He denies fevers or shortness of breath.  He endorses coughing and congestion. The patient is a nonsmoker. PMH significant for umbilical hernia. The patient currently takes no prescription medications.   HPI     Home Medications Prior to Admission medications   Medication Sig Start Date End Date Taking? Authorizing Provider  benzonatate (TESSALON) 100 MG capsule Take 1 capsule (100 mg total) by mouth every 8 (eight) hours. 02/11/22  Yes Cherlynn June B, PA  naproxen (NAPROSYN) 500 MG tablet Take 1 tablet (500 mg total) by mouth 2 (two) times daily. 02/11/22  Yes Cherlynn June B, PA  sulfamethoxazole-trimethoprim (BACTRIM DS,SEPTRA DS) 800-160 MG per tablet Take 1 tablet by mouth 2 (two) times daily. 14 day supply started 8/29 08/16/15   [provider]      Allergies    Penicillins    Review of Systems   Review of Systems  Constitutional:  Negative for fever.  HENT:  Positive for congestion.   Respiratory:  Positive for cough. Negative for chest tightness and shortness of breath.   Cardiovascular:  Positive for chest pain (Left sided with movement).  Gastrointestinal:  Negative for abdominal pain, nausea and vomiting.   Physical Exam Updated Vital Signs BP 111/74    Pulse 68    Temp 99.3 F (37.4 C) (Oral)    Resp 18    Ht 6\' 5"  (1.956 m)    Wt 99.8 kg    SpO2 98%    BMI 26.09 kg/m  Physical Exam Constitutional:       General: He is not in acute distress.    Appearance: He is normal weight.  HENT:     Head: Normocephalic.  Cardiovascular:     Rate and Rhythm: Normal rate and regular rhythm.     Heart sounds: Normal heart sounds.  Pulmonary:     Effort: Pulmonary effort is normal. No tachypnea or respiratory distress.     Breath sounds: Normal breath sounds.  Chest:     Chest wall: Tenderness (Tenderness to palpation under left breast along ribcage) present.  Musculoskeletal:        General: Normal range of motion.     Cervical back: Normal range of motion and neck supple.  Skin:    General: Skin is warm and dry.  Neurological:     Mental Status: He is alert.    ED Results / Procedures / Treatments   Labs (all labs ordered are listed, but only abnormal results are displayed) Labs Reviewed  RESP PANEL BY RT-PCR (FLU A&B, COVID) [id number]    EKG EKG Interpretation  Date/Time:  Saturday February 11 2022 10:15:53 EST Ventricular Rate:  86 PR Interval:  134 QRS Duration: 84 QT Interval:  344 QTC Calculation: 411 R Axis:   29 Text Interpretation: Normal sinus rhythm Normal ECG When compared with ECG of 17-Oct-2003 11:20, PREVIOUS ECG IS PRESENT Confirmed by Kommor, [place] [phone])  on 02/11/2022 10:46:34 AM  Radiology DG Chest Port 1 View  Result Date: 02/11/2022 CLINICAL DATA:  c/o non-radiating left side CP starting last night. Reports recent illness, cough and congestion. EXAM: PORTABLE CHEST 1 VIEW COMPARISON:  None. FINDINGS: Normal heart, mediastinum and hila. Clear lungs.  No pleural effusion or pneumothorax. Skeletal structures are grossly intact. IMPRESSION: No active disease. Electronically Signed   By: Lajean Manes M.D.   On: 02/11/2022 11:06    Procedures Procedures    Medications Ordered in ED Medications  lidocaine (LIDODERM) 5 % 1 patch (1 patch Transdermal Patch Applied 02/11/22 1100)  ibuprofen (ADVIL) tablet 600 mg (600 mg Oral Given 02/11/22 1059)    ED Course/ Medical  Decision Making/ A&P                           Medical Decision Making Amount and/or Complexity of Data Reviewed Radiology: ordered.  Risk Prescription drug management.   This patient presents to the ED for concern of left sided chest pain, this involves an extensive number of treatment options, and is a complaint that carries with it a high risk of complications and morbidity.  The differential diagnosis includes but is not limited to musculoskeletal injury, spontaneous pneumothorax, pneumonia, ACS, and others    Lab Tests:  I considered troponin testing but the patient's symptoms and presentation did not indicate it at this time. Influenza and covid testing were negative   Imaging Studies ordered:  I ordered imaging studies including chest x-ray  I independently visualized and interpreted imaging which showed no active disease, no rib fracture I agree with the radiologist interpretation   Medicines ordered and prescription drug management:  I ordered medication including ibuprofen for inflammation  Reevaluation of the patient after these medicines showed that the patient improved I have reviewed the patients home medicines and have made adjustments as needed   Reevaluation:  After the interventions noted above, I reevaluated the patient and found that they have :improved  Dispostion:  After consideration of the diagnostic results and the patients response to treatment, I feel that the patent would benefit from discharge home. The patient's chest wall pain improved with both the ibuprofen and lidocaine patch. His pain is reproducible with movement and his chest is tender to palpation. My level of concern for ACS is extremely low based on presentation and history. I reviewed the chest x-ray which shows no signs of active disease, including pneumothorax or pneumonia. I believe the patient has chest wall pain. I see no indication for hospital admission. I will discharge the  patient home with an NSAID prescription and a prescription for Tessalon pearls for his cough. He may follow up with his PCP if his symptoms do not improve.  Strict return precautions provided including new or worsening chest pain, shortness of breath, and altered level of consciousness. The patient agreed with the plan   Final Clinical Impression(s) / ED Diagnoses Final diagnoses:  Chest pain  Chest wall pain    Rx / DC Orders ED Discharge Orders          Ordered    benzonatate (TESSALON) 100 MG capsule  Every 8 hours        02/11/22 1138    naproxen (NAPROSYN) 500 MG tablet  2 times daily        02/11/22 1138              Dorothyann Peng, [place] 02/11/22 1138  Dorothyann Peng, [place] 02/11/22 1140    Teressa Lower, MD 02/11/22 [phone]

## 2022-06-19 IMAGING — DX DG CHEST 1V PORT
1 series · 1 of 1 positions shown · non-contrast
Comparison: None.

CLINICAL DATA: c/o non-radiating left side CP starting last night.
Reports recent illness, cough and congestion.

EXAM:
PORTABLE CHEST 1 VIEW

[chest ap]
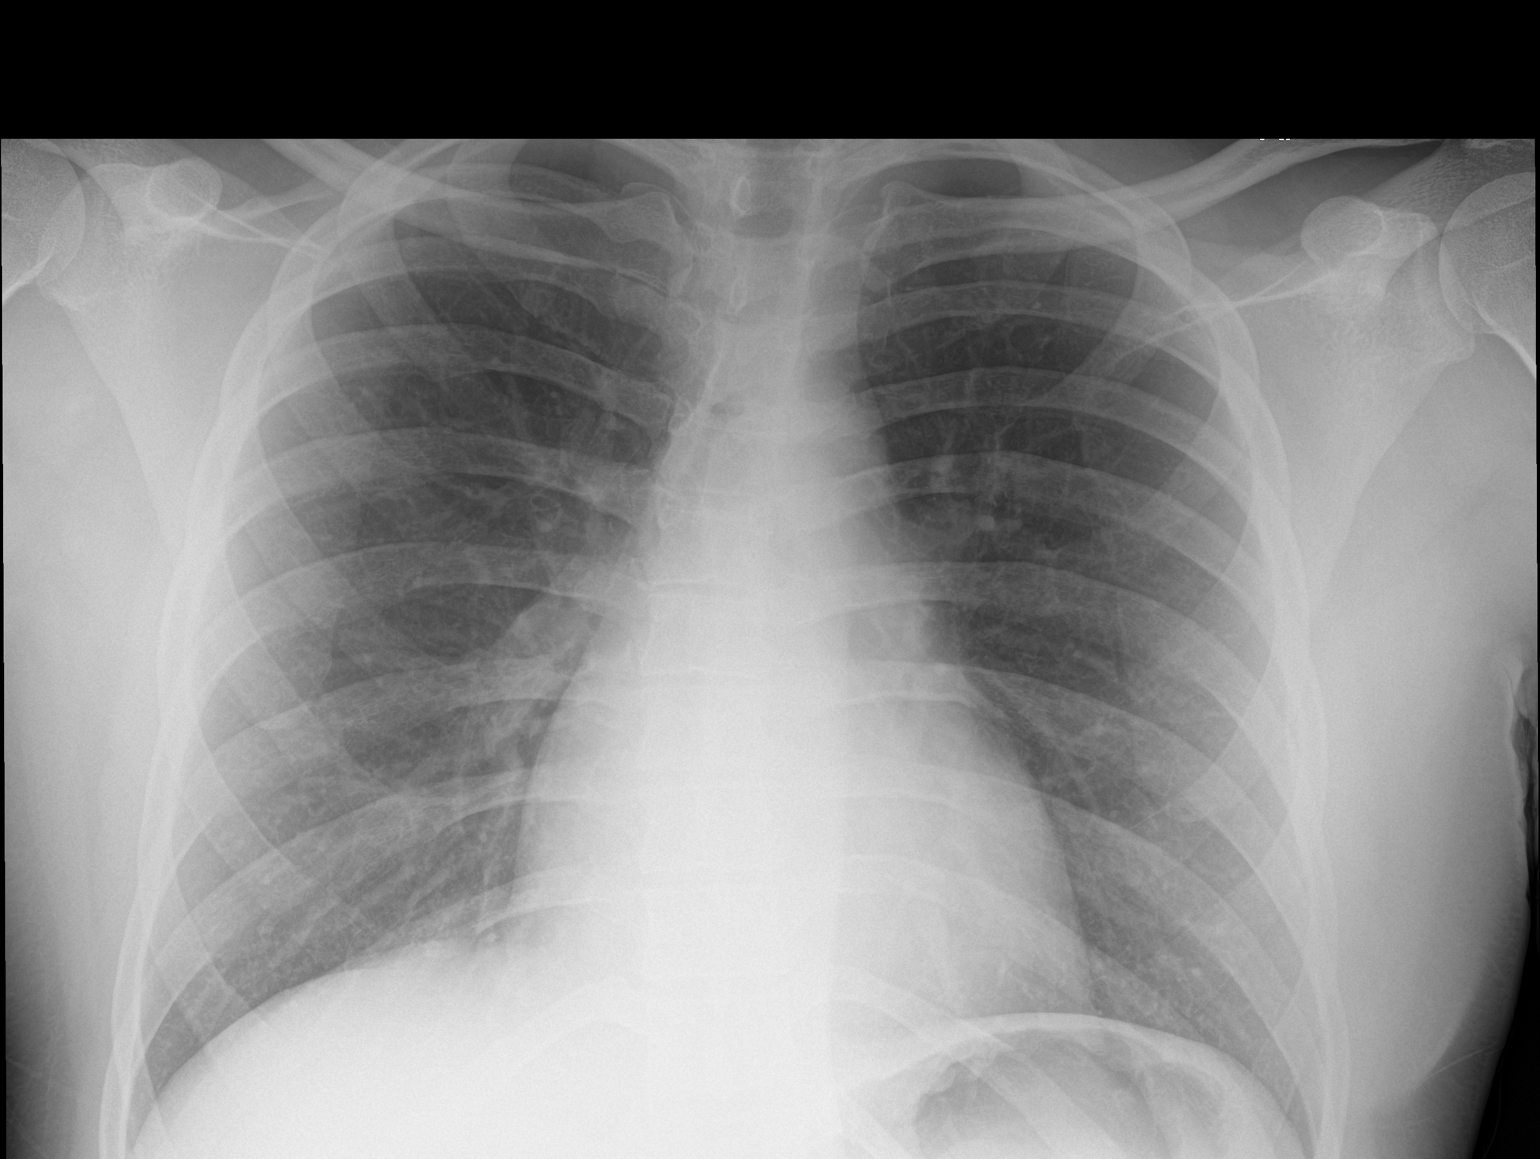

[1 of 1 positions shown; findings below may reference images not displayed]

FINDINGS: Normal heart, mediastinum and hila.

Clear lungs.  No pleural effusion or pneumothorax.

Skeletal structures are grossly intact.
IMPRESSION: No active disease.
